# Patient Record
Sex: Female | Born: 1950 | Race: Black or African American | Hispanic: No | Marital: Single | Smoking: Never smoker
Health system: Southern US, Community
[De-identification: ages and names within clinical notes are randomized; demographics above are authoritative.]

## PROBLEM LIST (undated history)

## (undated) HISTORY — PX: BACK SURGERY: SHX140

## (undated) HISTORY — PX: HEMORRHOID SURGERY: SHX153

---

## 1999-05-18 ENCOUNTER — Inpatient Hospital Stay (HOSPITAL_COMMUNITY): Admission: AD | Admit: 1999-05-18 | Discharge: 1999-05-18 | Payer: Self-pay | Admitting: *Deleted

## 2000-02-02 ENCOUNTER — Emergency Department (HOSPITAL_COMMUNITY): Admission: EM | Admit: 2000-02-02 | Discharge: 2000-02-02 | Payer: Self-pay

## 2001-02-24 ENCOUNTER — Emergency Department (HOSPITAL_COMMUNITY): Admission: EM | Admit: 2001-02-24 | Discharge: 2001-02-24 | Payer: Self-pay | Admitting: Emergency Medicine

## 2002-02-25 ENCOUNTER — Emergency Department (HOSPITAL_COMMUNITY): Admission: EM | Admit: 2002-02-25 | Discharge: 2002-02-25 | Payer: Self-pay | Admitting: Emergency Medicine

## 2004-02-27 ENCOUNTER — Emergency Department (HOSPITAL_COMMUNITY): Admission: EM | Admit: 2004-02-27 | Discharge: 2004-02-27 | Payer: Self-pay | Admitting: *Deleted

## 2005-11-07 ENCOUNTER — Emergency Department (HOSPITAL_COMMUNITY): Admission: EM | Admit: 2005-11-07 | Discharge: 2005-11-07 | Payer: Self-pay | Admitting: *Deleted

## 2007-11-25 ENCOUNTER — Emergency Department (HOSPITAL_COMMUNITY): Admission: EM | Admit: 2007-11-25 | Discharge: 2007-11-25 | Payer: Self-pay | Admitting: Emergency Medicine

## 2009-02-08 ENCOUNTER — Emergency Department (HOSPITAL_COMMUNITY): Admission: EM | Admit: 2009-02-08 | Discharge: 2009-02-09 | Payer: Self-pay | Admitting: Emergency Medicine

## 2009-04-06 ENCOUNTER — Ambulatory Visit: Payer: Self-pay | Admitting: Nurse Practitioner

## 2009-04-06 DIAGNOSIS — J45909 Unspecified asthma, uncomplicated: Secondary | ICD-10-CM | POA: Insufficient documentation

## 2009-04-06 DIAGNOSIS — J449 Chronic obstructive pulmonary disease, unspecified: Secondary | ICD-10-CM

## 2009-04-06 DIAGNOSIS — J4489 Other specified chronic obstructive pulmonary disease: Secondary | ICD-10-CM | POA: Insufficient documentation

## 2009-04-06 DIAGNOSIS — K029 Dental caries, unspecified: Secondary | ICD-10-CM | POA: Insufficient documentation

## 2009-04-07 ENCOUNTER — Encounter (INDEPENDENT_AMBULATORY_CARE_PROVIDER_SITE_OTHER): Payer: Self-pay | Admitting: Nurse Practitioner

## 2010-12-26 ENCOUNTER — Encounter: Payer: Self-pay | Admitting: Interventional Cardiology

## 2011-03-17 LAB — PROTIME-INR
INR: 1 (ref 0.00–1.49)
Prothrombin Time: 13.7 seconds (ref 11.6–15.2)

## 2011-03-17 LAB — DIFFERENTIAL
Basophils Absolute: 0 10*3/uL (ref 0.0–0.1)
Basophils Relative: 0 % (ref 0–1)
Neutro Abs: 6.1 10*3/uL (ref 1.7–7.7)
Neutrophils Relative %: 68 % (ref 43–77)

## 2011-03-17 LAB — URINALYSIS, ROUTINE W REFLEX MICROSCOPIC
Glucose, UA: NEGATIVE mg/dL
Leukocytes, UA: NEGATIVE
Protein, ur: NEGATIVE mg/dL
Specific Gravity, Urine: 1.023 (ref 1.005–1.030)
pH: 6.5 (ref 5.0–8.0)

## 2011-03-17 LAB — URINE CULTURE

## 2011-03-17 LAB — COMPREHENSIVE METABOLIC PANEL
Alkaline Phosphatase: 192 U/L — ABNORMAL HIGH (ref 39–117)
BUN: 7 mg/dL (ref 6–23)
Chloride: 105 mEq/L (ref 96–112)
Creatinine, Ser: 0.87 mg/dL (ref 0.4–1.2)
Glucose, Bld: 107 mg/dL — ABNORMAL HIGH (ref 70–99)
Potassium: 3.6 mEq/L (ref 3.5–5.1)
Total Bilirubin: 0.6 mg/dL (ref 0.3–1.2)
Total Protein: 8.7 g/dL — ABNORMAL HIGH (ref 6.0–8.3)

## 2011-03-17 LAB — CBC
HCT: 40.9 % (ref 36.0–46.0)
Hemoglobin: 13.5 g/dL (ref 12.0–15.0)
MCV: 82.7 fL (ref 78.0–100.0)
RDW: 13.6 % (ref 11.5–15.5)

## 2011-03-17 LAB — URINE MICROSCOPIC-ADD ON

## 2011-03-17 LAB — POCT I-STAT, CHEM 8
BUN: 8 mg/dL (ref 6–23)
Calcium, Ion: 1.19 mmol/L (ref 1.12–1.32)
Chloride: 104 mEq/L (ref 96–112)
Creatinine, Ser: 0.9 mg/dL (ref 0.4–1.2)
Glucose, Bld: 102 mg/dL — ABNORMAL HIGH (ref 70–99)

## 2011-03-17 LAB — POCT CARDIAC MARKERS: Troponin i, poc: <0.05 ng/mL (ref 0.00–0.09)

## 2012-09-11 ENCOUNTER — Encounter (HOSPITAL_COMMUNITY): Payer: Self-pay | Admitting: Emergency Medicine

## 2012-09-11 ENCOUNTER — Emergency Department (HOSPITAL_COMMUNITY)
Admission: EM | Admit: 2012-09-11 | Discharge: 2012-09-11 | Disposition: A | Discharge status: Home / Self Care | Payer: Self-pay | Attending: Emergency Medicine | Admitting: Emergency Medicine

## 2012-09-11 DIAGNOSIS — K047 Periapical abscess without sinus: Secondary | ICD-10-CM | POA: Insufficient documentation

## 2012-09-11 DIAGNOSIS — K029 Dental caries, unspecified: Secondary | ICD-10-CM | POA: Insufficient documentation

## 2012-09-11 MED ORDER — PENICILLIN V POTASSIUM 500 MG PO TABS: 500.0000 mg | ORAL_TABLET | Freq: Three times a day (TID) | ORAL | Status: DC

## 2012-09-11 MED ORDER — OXYCODONE-ACETAMINOPHEN 5-325 MG PO TABS
1.0000 | ORAL_TABLET | Freq: Once | ORAL | Status: AC
  Administered 2012-09-11: 1 via ORAL
  Filled 2012-09-11: qty 1

## 2012-09-11 MED ORDER — OXYCODONE-ACETAMINOPHEN 5-325 MG PO TABS: 1.0000 | ORAL_TABLET | Freq: Four times a day (QID) | ORAL | Status: DC | PRN

## 2012-09-11 MED ORDER — PENICILLIN V POTASSIUM 500 MG PO TABS
500.0000 mg | ORAL_TABLET | Freq: Once | ORAL | Status: AC
  Administered 2012-09-11: 500 mg via ORAL
  Filled 2012-09-11: qty 1

## 2012-09-11 NOTE — ED Provider Notes (Signed)
History     CSN: [id number]  Arrival date & time 09/11/12  2209   First MD Initiated Contact with Patient 09/11/12 2318      Chief Complaint  Patient presents with  . Dental Pain    (Consider location/radiation/quality/duration/timing/severity/associated sxs/prior treatment) HPI Comments: Patient presents with 2 days of left mandibular dental pain and swelling. Patient states she's been running low-grade fevers at home. No vomiting. Patient has been taking over-the-counter pain relievers without relief. She's not having any neck pain, neck swelling, difficulty breathing. Onset gradual. Course is constant. Nothing makes symptoms better. Palpation makes symptoms worse.  The history is provided by the patient.    History reviewed. No pertinent past medical history.  No past surgical history on file.  No family history on file.  History  Substance Use Topics  . Smoking status: Never Smoker   . Smokeless tobacco: Not on file  . Alcohol Use: No    OB History    Grav Para Term Preterm Abortions TAB SAB Ect Mult Living                  Review of Systems  Constitutional: Positive for fever.  HENT: Positive for facial swelling and dental problem. Negative for ear pain, sore throat, trouble swallowing and neck pain.   Respiratory: Negative for shortness of breath and stridor.   Skin: Negative for color change.  Neurological: Negative for headaches.    Allergies  Review of patient's allergies indicates no known allergies.  Home Medications   Current Outpatient Rx  Name Route Sig Dispense Refill  . GOODY HEADACHE PO Oral Take 1 packet by mouth every 6 (six) hours as needed. Pain    . OXYCODONE-ACETAMINOPHEN 5-325 MG PO TABS Oral Take 1-2 tablets by mouth every 6 (six) hours as needed for pain. 12 tablet 0  . PENICILLIN V POTASSIUM 500 MG PO TABS Oral Take 1 tablet (500 mg total) by mouth 3 (three) times daily. 21 tablet 0    BP 157/99  Pulse 95  Temp 99.9 F (37.7 C)  (Oral)  Resp 18  SpO2 98%  Physical Exam  Nursing note and vitals reviewed. Constitutional: She appears well-developed and well-nourished.  HENT:  Head: Normocephalic and atraumatic. No trismus in the jaw.  Right Ear: Tympanic membrane, external ear and ear canal normal.  Left Ear: Tympanic membrane, external ear and ear canal normal.  Nose: Nose normal.  Mouth/Throat: Uvula is midline, oropharynx is clear and moist and mucous membranes are normal. Abnormal dentition. Dental caries present. No dental abscesses or uvula swelling. No tonsillar abscesses.       Soft tissue swelling overlying left mandible. Swelling does not extend towards the neck.  Eyes: Conjunctivae normal are normal.  Neck: Normal range of motion. Neck supple.       No neck swelling or Ludwig's angina  Lymphadenopathy:    She has no cervical adenopathy.  Neurological: She is alert.  Skin: Skin is warm and dry.  Psychiatric: She has a normal mood and affect.    ED Course  Procedures (including critical care time)  Labs Reviewed - No data to display No results found.   1. Dental abscess    11:45 PM Patient seen and examined. Medications ordered.   Vital signs reviewed and are as follows: Filed Vitals:   09/11/12 2215  BP: 157/99  Pulse: 95  Temp: 99.9 F (37.7 C)  Resp: 18   Patient counseled to take prescribed medications as directed, return  with worsening facial or neck swelling, and to follow-up with his dentist as soon as possible. Referral given.   Patient counseled on use of narcotic pain medications. Counseled not to combine these medications with others containing tylenol. Urged not to drink alcohol, drive, or perform any other activities that requires focus while taking these medications. The patient verbalizes understanding and agrees with the plan.   MDM  Patient with dental abscess.  Exam unconcerning for Ludwig's angina or other deep tissue infection in neck.  Will treat with penicillin  and pain medicine.  Urged patient to follow-up with dentist.           Renne Crigler, PA 09/12/12 [phone]

## 2012-09-11 NOTE — ED Notes (Signed)
Pt states that she started having pain on her L lower side of her mouth yesterday and now has significant swelling and a low grade fever. States she doesn't go to the dentist and has had problems like this before.

## 2012-09-12 NOTE — ED Provider Notes (Signed)
Medical screening examination/treatment/procedure(s) were performed by non-physician practitioner and as supervising physician I was immediately available for consultation/collaboration.   David H Yao, MD 09/12/12 0624 

## 2016-04-04 ENCOUNTER — Encounter (HOSPITAL_COMMUNITY): Payer: Self-pay

## 2016-04-04 ENCOUNTER — Emergency Department (HOSPITAL_COMMUNITY)
Admission: EM | Admit: 2016-04-04 | Discharge: 2016-04-04 | Disposition: A | Discharge status: Home / Self Care | Payer: No Typology Code available for payment source | Attending: Emergency Medicine | Admitting: Emergency Medicine

## 2016-04-04 ENCOUNTER — Emergency Department (HOSPITAL_COMMUNITY): Payer: No Typology Code available for payment source

## 2016-04-04 DIAGNOSIS — Y999 Unspecified external cause status: Secondary | ICD-10-CM | POA: Diagnosis not present

## 2016-04-04 DIAGNOSIS — Z7982 Long term (current) use of aspirin: Secondary | ICD-10-CM | POA: Insufficient documentation

## 2016-04-04 DIAGNOSIS — Z79899 Other long term (current) drug therapy: Secondary | ICD-10-CM | POA: Diagnosis not present

## 2016-04-04 DIAGNOSIS — Z79891 Long term (current) use of opiate analgesic: Secondary | ICD-10-CM | POA: Diagnosis not present

## 2016-04-04 DIAGNOSIS — S060X1A Concussion with loss of consciousness of 30 minutes or less, initial encounter: Secondary | ICD-10-CM | POA: Insufficient documentation

## 2016-04-04 DIAGNOSIS — R4020 Unspecified coma: Secondary | ICD-10-CM

## 2016-04-04 DIAGNOSIS — R51 Headache: Secondary | ICD-10-CM | POA: Diagnosis present

## 2016-04-04 DIAGNOSIS — Y92410 Unspecified street and highway as the place of occurrence of the external cause: Secondary | ICD-10-CM | POA: Diagnosis not present

## 2016-04-04 DIAGNOSIS — Y939 Activity, unspecified: Secondary | ICD-10-CM | POA: Insufficient documentation

## 2016-04-04 MED ORDER — CYCLOBENZAPRINE HCL 10 MG PO TABS: 10.0000 mg | ORAL_TABLET | Freq: Two times a day (BID) | ORAL | Status: DC | PRN

## 2016-04-04 MED ORDER — NAPROXEN 500 MG PO TABS: 500.0000 mg | ORAL_TABLET | Freq: Two times a day (BID) | ORAL | Status: DC

## 2016-04-04 NOTE — ED Notes (Signed)
Pt walked to restroom and placed in gown for CT.
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
## 2017-09-14 ENCOUNTER — Emergency Department (HOSPITAL_COMMUNITY): Payer: No Typology Code available for payment source

## 2017-09-14 ENCOUNTER — Emergency Department (HOSPITAL_COMMUNITY)
Admission: EM | Admit: 2017-09-14 | Discharge: 2017-09-14 | Disposition: A | Discharge status: Home / Self Care | Payer: No Typology Code available for payment source | Attending: Emergency Medicine | Admitting: Emergency Medicine

## 2017-09-14 ENCOUNTER — Encounter (HOSPITAL_COMMUNITY): Payer: Self-pay

## 2017-09-14 DIAGNOSIS — S5012XA Contusion of left forearm, initial encounter: Secondary | ICD-10-CM | POA: Diagnosis not present

## 2017-09-14 DIAGNOSIS — S60222A Contusion of left hand, initial encounter: Secondary | ICD-10-CM | POA: Insufficient documentation

## 2017-09-14 DIAGNOSIS — Y92410 Unspecified street and highway as the place of occurrence of the external cause: Secondary | ICD-10-CM | POA: Insufficient documentation

## 2017-09-14 DIAGNOSIS — Z79899 Other long term (current) drug therapy: Secondary | ICD-10-CM | POA: Insufficient documentation

## 2017-09-14 DIAGNOSIS — R412 Retrograde amnesia: Secondary | ICD-10-CM | POA: Diagnosis not present

## 2017-09-14 DIAGNOSIS — Y999 Unspecified external cause status: Secondary | ICD-10-CM | POA: Insufficient documentation

## 2017-09-14 DIAGNOSIS — S161XXA Strain of muscle, fascia and tendon at neck level, initial encounter: Secondary | ICD-10-CM | POA: Diagnosis not present

## 2017-09-14 DIAGNOSIS — Y939 Activity, unspecified: Secondary | ICD-10-CM | POA: Diagnosis not present

## 2017-09-14 DIAGNOSIS — S40022A Contusion of left upper arm, initial encounter: Secondary | ICD-10-CM

## 2017-09-14 DIAGNOSIS — T148XXA Other injury of unspecified body region, initial encounter: Secondary | ICD-10-CM

## 2017-09-14 DIAGNOSIS — S0990XA Unspecified injury of head, initial encounter: Secondary | ICD-10-CM | POA: Diagnosis present

## 2017-09-14 MED ORDER — HYDROCODONE-ACETAMINOPHEN 5-325 MG PO TABS: 1.0000 | ORAL_TABLET | ORAL | 0 refills | Status: DC | PRN

## 2017-09-14 MED ORDER — HYDROCODONE-ACETAMINOPHEN 5-325 MG PO TABS
1.0000 | ORAL_TABLET | Freq: Once | ORAL | Status: AC
  Administered 2017-09-14: 1 via ORAL
  Filled 2017-09-14: qty 1

## 2017-09-14 NOTE — Discharge Instructions (Signed)
As we discussed you will be sore for the next few days.   You can take Tylenol or Ibuprofen as directed for pain. You can alternate Tylenol and Ibuprofen every 4 hours. If you take Tylenol at 1pm, then you can take Ibuprofen at 5pm. Then you can take Tylenol again at 9pm.   Take the pain medication for severe or break through pain. Do not take the pain medication at the same time as the tylenol. The pain medication may make you sleepy so do not drive or operate machinery while taking it.   As we discussed, you need to follow up with her primary care doctor or the referred: [place] in the next few days for evaluation of her high blood pressure.  Return to the Emergency Department for any worsening pain, chest pain, difficulty breathing, vomiting, numbness/weakness of your arms or legs, difficulty walking or any other worsening or concerning symptoms.
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
## 2017-09-14 NOTE — ED Notes (Signed)
Pt did not want to sign discharge instructions, denied any questions and reported she understood discharge instructions and prescriptions.
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
## 2017-09-14 NOTE — ED Notes (Signed)
Patient transported to X-ray 
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
## 2020-04-08 ENCOUNTER — Other Ambulatory Visit: Payer: Self-pay | Admitting: Family Medicine

## 2020-04-08 DIAGNOSIS — R42 Dizziness and giddiness: Secondary | ICD-10-CM

## 2020-04-08 DIAGNOSIS — R531 Weakness: Secondary | ICD-10-CM

## 2020-04-08 DIAGNOSIS — Z8659 Personal history of other mental and behavioral disorders: Secondary | ICD-10-CM

## 2020-04-23 ENCOUNTER — Ambulatory Visit
Admission: RE | Admit: 2020-04-23 | Discharge: 2020-04-23 | Disposition: A | Discharge status: Home / Self Care | Payer: Medicare Other | Source: Ambulatory Visit | Attending: Family Medicine | Admitting: Family Medicine

## 2020-04-23 DIAGNOSIS — Z8659 Personal history of other mental and behavioral disorders: Secondary | ICD-10-CM

## 2020-04-23 DIAGNOSIS — R42 Dizziness and giddiness: Secondary | ICD-10-CM

## 2020-04-23 DIAGNOSIS — R531 Weakness: Secondary | ICD-10-CM

## 2020-11-21 ENCOUNTER — Other Ambulatory Visit: Payer: Medicare Other

## 2020-11-21 DIAGNOSIS — Z20822 Contact with and (suspected) exposure to covid-19: Secondary | ICD-10-CM

## 2020-11-24 LAB — NOVEL CORONAVIRUS, NAA: SARS-CoV-2, NAA: NOT DETECTED

## 2021-02-18 IMAGING — CT CT HEAD W/O CM
3 of 4 series · 15 of 47 positions shown, 18 images · non-contrast
Comparison: Head CT 09/14/2017

CLINICAL DATA: Mental status change resolved. Dizziness. Weakness.
Additional history provided: Patient reports dizziness for "a couple
of months."

EXAM:
CT HEAD WITHOUT CONTRAST
TECHNIQUE: Contiguous axial images were obtained from the base of the skull
through the vertex without intravenous contrast.

[Series 2: head 5.00 hr40 s3 axial ibhc · axial · 0.45mm/px · z∈[-570,-430]mm · 9 of 34 slices shown, 12 images]
[im 3/34  brain]
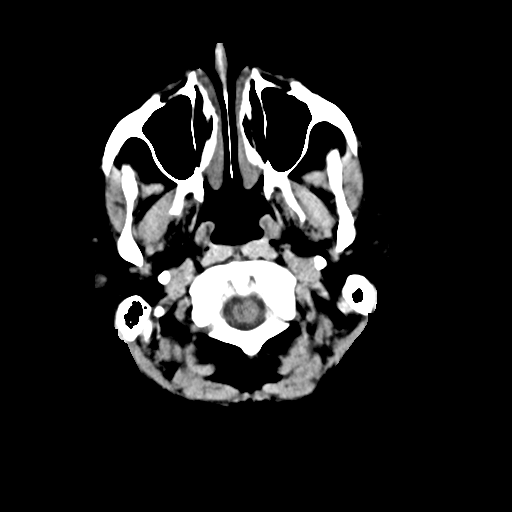
[im 3/34  bone]
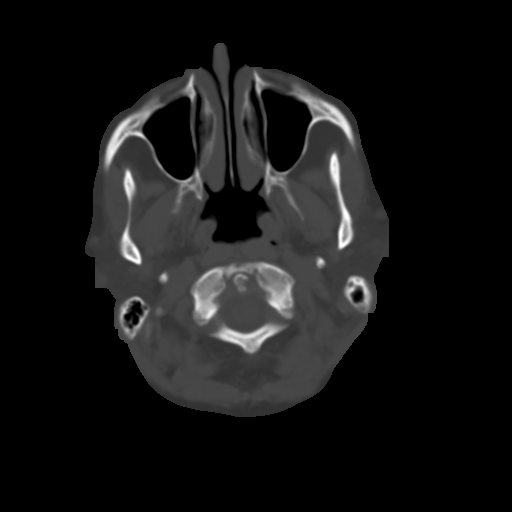
[im 8/34  brain]
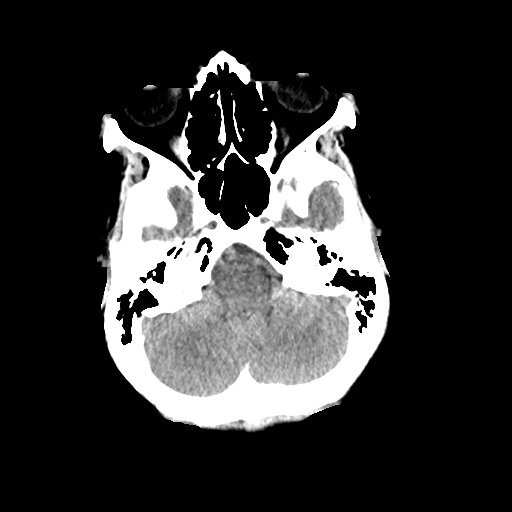
[im 10/34  brain]
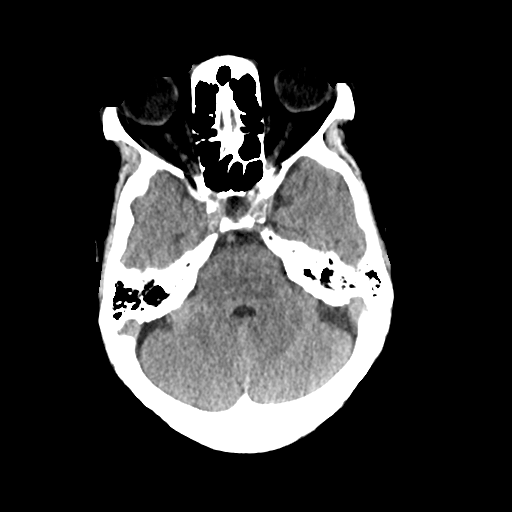
[im 15/34  brain]
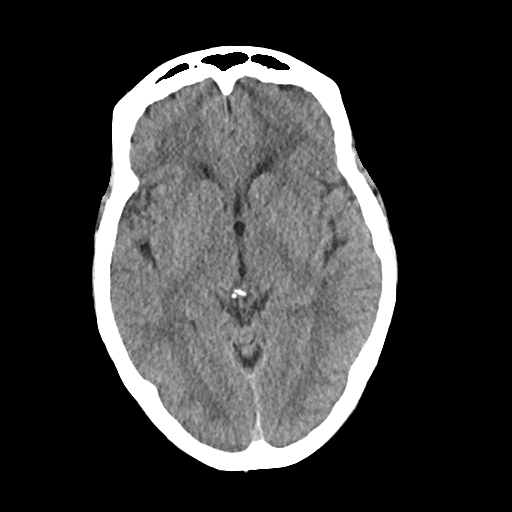
[im 17/34  brain]
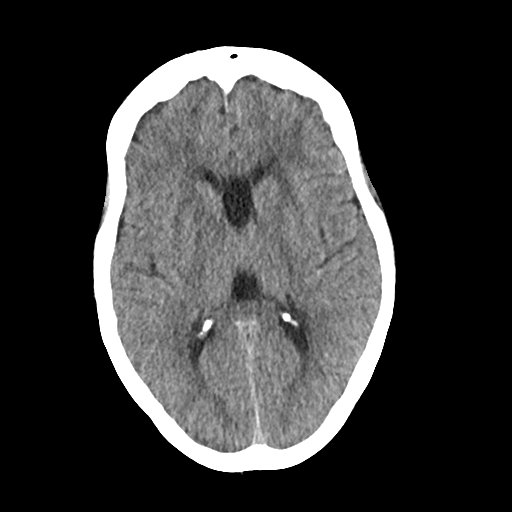
[im 17/34  bone]
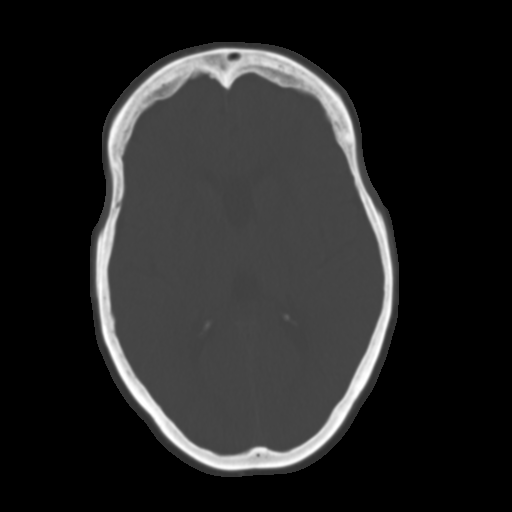
[im 19/34  brain]
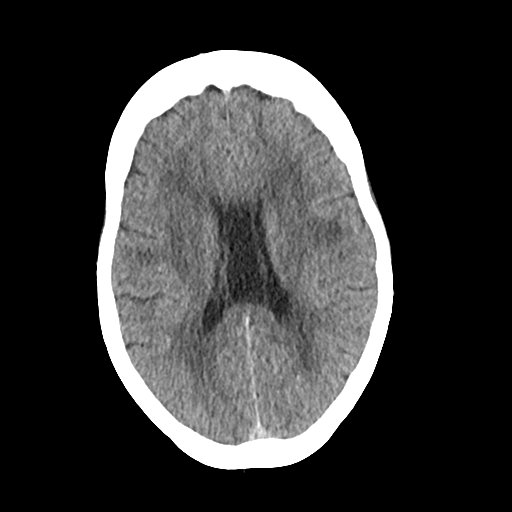
[im 24/34  brain]
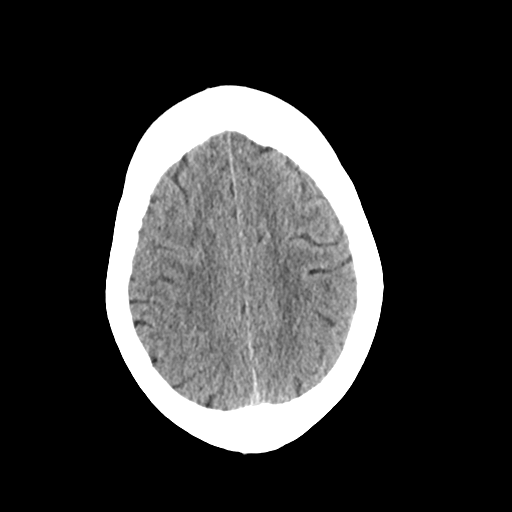
[im 26/34  brain]
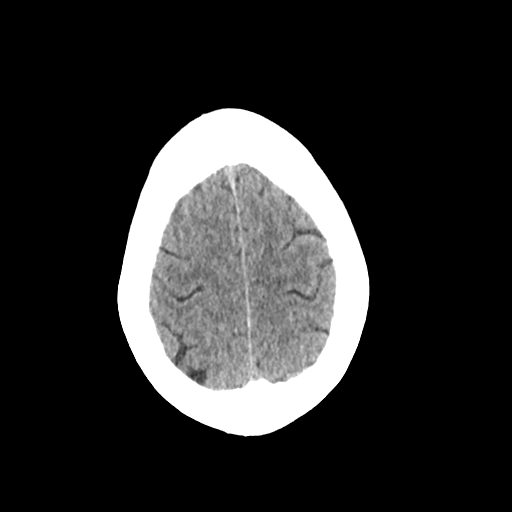
[im 31/34  brain]
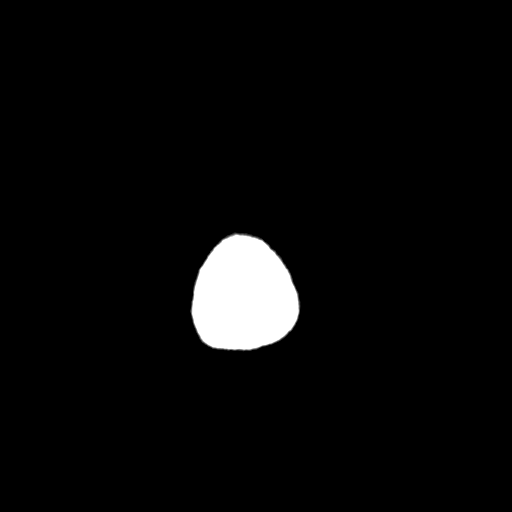
[im 31/34  bone]
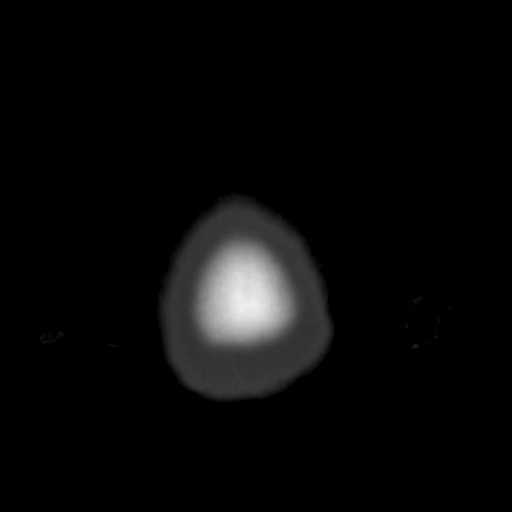

[Series 4: head 3.00 hr40 s3 sag · sagittal · 0.34mm/px · 3 of 59 slices shown]
[im 20/59  brain]
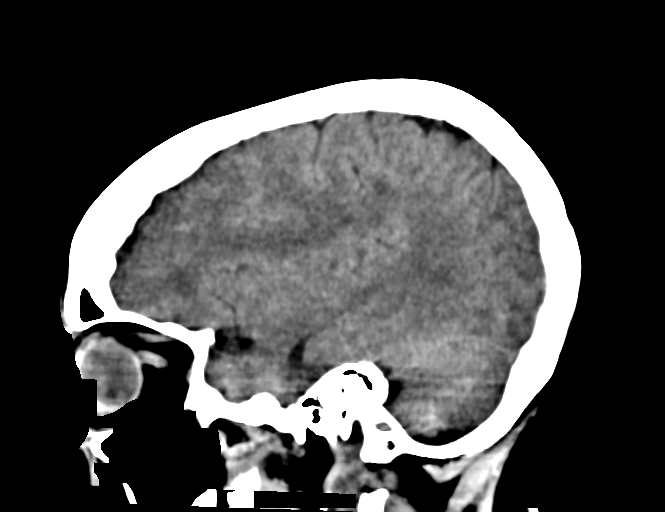
[im 30/59  brain]
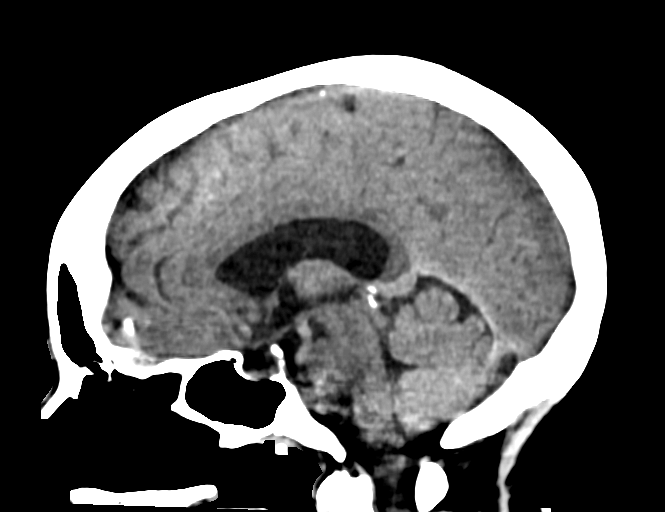
[im 39/59  brain]
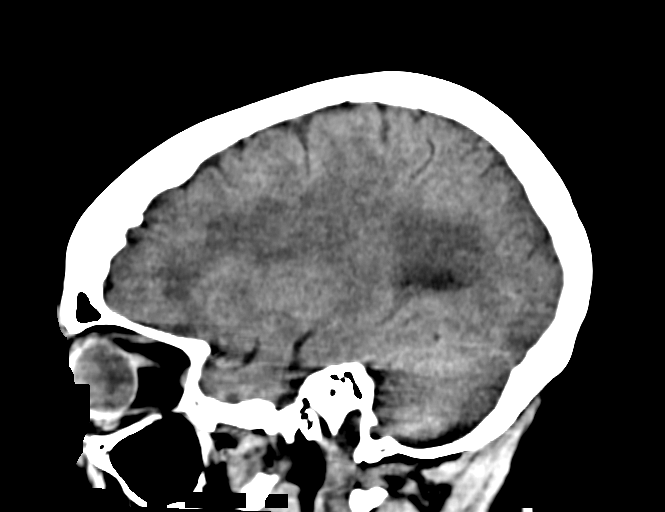

[Series 6: head 3.00 hr40 s3 cor · coronal · 0.34mm/px · 3 of 72 slices shown]
[im 24/72  brain]
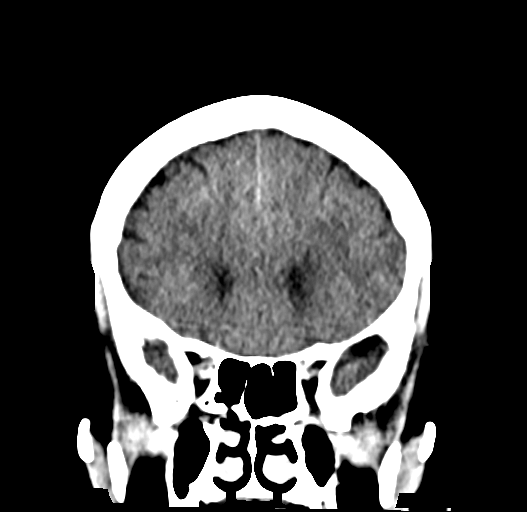
[im 32/72  brain]
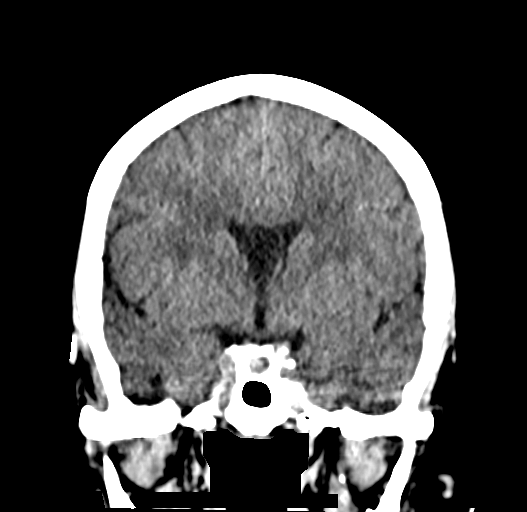
[im 40/72  brain]
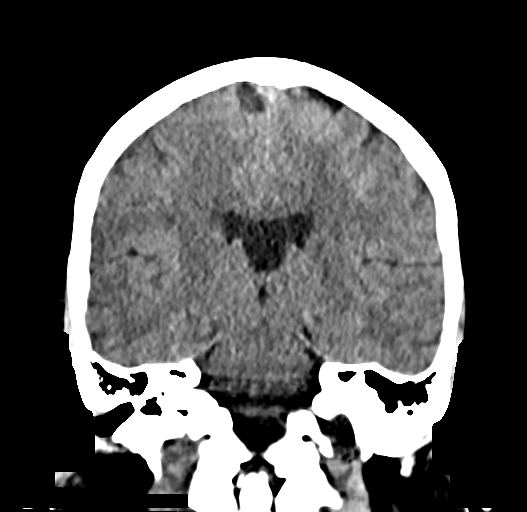

[15 of 47 positions shown; findings below may reference images not displayed]

FINDINGS: Brain:

Cerebral volume is normal for age.

Stable moderate ill-defined hypoattenuation within the cerebral
white matter which is nonspecific, but most commonly seen on the
basis of chronic small vessel ischemia.

There is no acute intracranial hemorrhage.

No demarcated cortical infarct.

No extra-axial fluid collection.

No evidence of intracranial mass.

No midline shift.

Cavum septum pellucidum and cavum vergae.

Vascular: No hyperdense vessel.

Skull: Normal. Negative for fracture or focal lesion.

Sinuses/Orbits: Visualized orbits show no acute finding. No
significant paranasal sinus disease or mastoid effusion.

Other: 9 mm pedunculated soft tissue lesion versus hyperdense cyst
overlying the right aspect of the nose.
IMPRESSION: No evidence of acute intracranial abnormality.

Stable moderate ill-defined hypoattenuation within the cerebral
white matter which is nonspecific, but most commonly seen on the
basis of chronic small vessel ischemia.

9 mm pedunculated soft tissue lesion versus hyperdense cyst
overlying the right aspect of the nose. Correlate with findings on
direct visualization.

## 2021-04-26 ENCOUNTER — Ambulatory Visit: Payer: Medicare Other | Admitting: Podiatry

## 2021-09-09 ENCOUNTER — Encounter: Payer: Self-pay | Admitting: Nurse Practitioner

## 2021-09-09 ENCOUNTER — Ambulatory Visit (INDEPENDENT_AMBULATORY_CARE_PROVIDER_SITE_OTHER): Payer: Medicare Other | Admitting: Nurse Practitioner

## 2021-09-09 ENCOUNTER — Other Ambulatory Visit: Payer: Self-pay

## 2021-09-09 VITALS — BP 124/80 | HR 94 | Temp 98.1°F | Ht 65.0 in | Wt 138.0 lb

## 2021-09-09 DIAGNOSIS — R079 Chest pain, unspecified: Secondary | ICD-10-CM

## 2021-09-09 DIAGNOSIS — R531 Weakness: Secondary | ICD-10-CM | POA: Diagnosis not present

## 2021-09-09 DIAGNOSIS — R202 Paresthesia of skin: Secondary | ICD-10-CM

## 2021-09-09 DIAGNOSIS — R200 Anesthesia of skin: Secondary | ICD-10-CM

## 2021-09-09 DIAGNOSIS — E559 Vitamin D deficiency, unspecified: Secondary | ICD-10-CM | POA: Diagnosis not present

## 2021-09-09 DIAGNOSIS — R82998 Other abnormal findings in urine: Secondary | ICD-10-CM

## 2021-09-09 DIAGNOSIS — K5909 Other constipation: Secondary | ICD-10-CM

## 2021-09-09 DIAGNOSIS — Z79899 Other long term (current) drug therapy: Secondary | ICD-10-CM

## 2021-09-09 DIAGNOSIS — Z7689 Persons encountering health services in other specified circumstances: Secondary | ICD-10-CM

## 2021-09-09 DIAGNOSIS — K219 Gastro-esophageal reflux disease without esophagitis: Secondary | ICD-10-CM

## 2021-09-09 DIAGNOSIS — Z0000 Encounter for general adult medical examination without abnormal findings: Secondary | ICD-10-CM | POA: Diagnosis not present

## 2021-09-09 DIAGNOSIS — M62838 Other muscle spasm: Secondary | ICD-10-CM

## 2021-09-09 DIAGNOSIS — R7303 Prediabetes: Secondary | ICD-10-CM

## 2021-09-09 DIAGNOSIS — R051 Acute cough: Secondary | ICD-10-CM

## 2021-09-09 DIAGNOSIS — D229 Melanocytic nevi, unspecified: Secondary | ICD-10-CM

## 2021-09-09 LAB — POCT URINALYSIS DIPSTICK
Bilirubin, UA: NEGATIVE
Glucose, UA: NEGATIVE
Ketones, UA: NEGATIVE
Nitrite, UA: NEGATIVE
Protein, UA: NEGATIVE
Spec Grav, UA: >=1.03 — AB (ref 1.010–1.025)
Urobilinogen, UA: 0.2 E.U./dL
pH, UA: 6.5 (ref 5.0–8.0)

## 2021-09-09 LAB — POCT UA - MICROALBUMIN
Creatinine, POC: 300 mg/dL
Microalbumin Ur, POC: 80 mg/L

## 2021-09-09 MED ORDER — PANTOPRAZOLE SODIUM 20 MG PO TBEC: 20.0000 mg | DELAYED_RELEASE_TABLET | Freq: Every day | ORAL | 1 refills | Status: DC

## 2021-09-09 MED ORDER — NITROFURANTOIN MONOHYD MACRO 100 MG PO CAPS: 100.0000 mg | ORAL_CAPSULE | Freq: Two times a day (BID) | ORAL | 0 refills | Status: AC

## 2021-09-09 MED ORDER — BENZONATATE 100 MG PO CAPS: 100.0000 mg | ORAL_CAPSULE | Freq: Three times a day (TID) | ORAL | 1 refills | Status: DC | PRN

## 2021-09-09 MED ORDER — GUAIFENESIN-DM 100-10 MG/5ML PO SYRP: 5.0000 mL | ORAL_SOLUTION | ORAL | 0 refills | Status: DC | PRN

## 2021-09-09 NOTE — Progress Notes (Addendum)
This visit occurred during the SARS-CoV-2 public health emergency.  Safety protocols were in place, including screening questions prior to the visit, additional usage of staff PPE, and extensive cleaning of exam room while observing appropriate contact time as indicated for disinfecting solutions.  Subjective:     Patient ID: Casey Zhang , female    DOB: [birth year] , 70 y.o.   MRN: [id number]   Chief Complaint  Patient presents with   Establish Care    HPI  Patient is here to establish care and for a complete physical exam. She has not seen a provider in a very long time. She feels weak all the time. She also takes vinegar and water. She takes goody's all the time.    History reviewed. No pertinent past medical history.   Family History  Problem Relation Age of Onset   Diabetes Maternal Grandmother      Current Outpatient Medications:    benzonatate (TESSALON PERLES) 100 MG capsule, Take 1 capsule (100 mg total) by mouth 3 (three) times daily as needed for cough., Disp: 30 capsule, Rfl: 1   guaiFENesin-dextromethorphan (ROBITUSSIN DM) 100-10 MG/5ML syrup, Take 5 mLs by mouth every 4 (four) hours as needed for cough., Disp: 118 mL, Rfl: 0   nitrofurantoin, macrocrystal-monohydrate, (MACROBID) 100 MG capsule, Take 1 capsule (100 mg total) by mouth 2 (two) times daily for 7 days., Disp: 14 capsule, Rfl: 0   pantoprazole (PROTONIX) 20 MG tablet, Take 1 tablet (20 mg total) by mouth daily., Disp: 30 tablet, Rfl: 1   Aspirin-Acetaminophen-Caffeine (GOODY HEADACHE PO), Take 1 packet by mouth every 6 (six) hours as needed. Pain (Patient not taking: Reported on 09/09/2021), Disp: , Rfl:    cyclobenzaprine (FLEXERIL) 10 MG tablet, Take 1 tablet (10 mg total) by mouth 2 (two) times daily as needed for muscle spasms. (Patient not taking: Reported on 09/09/2021), Disp: 20 tablet, Rfl: 0   HYDROcodone-acetaminophen (NORCO/VICODIN) 5-325 MG tablet, Take 1-2 tablets by mouth every 4 (four) hours as  needed. (Patient not taking: Reported on 09/09/2021), Disp: 8 tablet, Rfl: 0   naproxen (NAPROSYN) 500 MG tablet, Take 1 tablet (500 mg total) by mouth 2 (two) times daily. (Patient not taking: Reported on 09/09/2021), Disp: 30 tablet, Rfl: 0   oxyCODONE-acetaminophen (PERCOCET/ROXICET) 5-325 MG per tablet, Take 1-2 tablets by mouth every 6 (six) hours as needed for pain. (Patient not taking: Reported on 04/04/2016), Disp: 12 tablet, Rfl: 0   penicillin v potassium (VEETID) 500 MG tablet, Take 1 tablet (500 mg total) by mouth 3 (three) times daily. (Patient not taking: Reported on 04/04/2016), Disp: 21 tablet, Rfl: 0   No Known Allergies    The patient states she uses none for birth control. Last LMP was No LMP recorded. Patient is postmenopausal.. Negative for Dysmenorrhea. Negative for: breast discharge, breast lump(s), breast pain and breast self exam. Associated symptoms include abnormal vaginal bleeding. Pertinent negatives include abnormal bleeding (hematology), anxiety, decreased libido, depression, difficulty falling sleep, dyspareunia, history of infertility, nocturia, sexual dysfunction, sleep disturbances, urinary incontinence, urinary urgency, vaginal discharge and vaginal itching. Diet regular.The patient states her exercise level is    . The patient's tobacco use is:  Social History   Tobacco Use  Smoking Status Never  Smokeless Tobacco Never  . She has been exposed to passive smoke. The patient's alcohol use is:  Social History   Substance and Sexual Activity  Alcohol Use No   Review of Systems  Constitutional: Negative.  Negative for chills  and fever.  HENT:  Negative for congestion, ear pain, rhinorrhea and tinnitus.   Respiratory:  Positive for cough. Negative for shortness of breath and wheezing.   Cardiovascular: Negative.  Negative for chest pain and palpitations.  Gastrointestinal: Negative.   Endocrine: Negative for polydipsia, polyphagia and polyuria.  Genitourinary:   Positive for frequency.  Musculoskeletal:  Positive for myalgias.       Muscle pain to the legs   Neurological:  Positive for weakness and numbness. Negative for dizziness, seizures and headaches.  Psychiatric/Behavioral: Negative.      Today's Vitals   09/09/21 1555 09/09/21 1619 09/09/21 1620  BP: 122/80 122/80 124/80  Pulse: 71 83 94  Temp: 98.1 F (36.7 C)    Weight: 138 lb (62.6 kg)    Height: $Remove'5\' 5"'IouXehy$  (1.651 m)     Body mass index is 22.96 kg/m.   Objective:  Physical Exam Vitals and nursing note reviewed.  Constitutional:      Appearance: Normal appearance.  HENT:     Head: Normocephalic and atraumatic.     Right Ear: Tympanic membrane, ear canal and external ear normal. There is no impacted cerumen.     Left Ear: Tympanic membrane, ear canal and external ear normal. There is no impacted cerumen.     Nose: Nose normal. No congestion or rhinorrhea.     Mouth/Throat:     Mouth: Mucous membranes are moist.     Pharynx: Oropharynx is clear.  Eyes:     Extraocular Movements: Extraocular movements intact.     Conjunctiva/sclera: Conjunctivae normal.     Pupils: Pupils are equal, round, and reactive to light.  Cardiovascular:     Rate and Rhythm: Normal rate and regular rhythm.     Pulses: Normal pulses.     Heart sounds: Normal heart sounds. No murmur heard. Pulmonary:     Effort: Pulmonary effort is normal. No respiratory distress.     Breath sounds: Normal breath sounds. No wheezing.  Abdominal:     General: Abdomen is flat. Bowel sounds are normal.     Palpations: Abdomen is soft.     Tenderness: There is no abdominal tenderness.  Genitourinary:    Comments: deferred Musculoskeletal:        General: No swelling or tenderness. Normal range of motion.     Cervical back: Normal range of motion and neck supple.  Skin:    General: Skin is warm and dry.     Capillary Refill: Capillary refill takes less than 2 seconds.     Comments: Numorous skin tags and moles    Neurological:     General: No focal deficit present.     Mental Status: She is alert and oriented to person, place, and time.  Psychiatric:        Mood and Affect: Mood normal.        Behavior: Behavior normal.        Assessment And Plan:     1. Encounter to establish care  2. Encounter for physical examination - POCT Urinalysis Dipstick (81002) - POCT UA - Microalbumin  3. Chest pain, unspecified type - EKG 12-Lead  4. Weakness - TSH + free T4 - CBC no Diff - CMP14+EGFR  5. Vitamin D deficiency - Vitamin D (25 hydroxy)  6. Prediabetes - Hemoglobin A1c  7. Gastroesophageal reflux disease without esophagitis - pantoprazole (PROTONIX) 20 MG tablet; Take 1 tablet (20 mg total) by mouth daily.  Dispense: 30 tablet; Refill: 1  8. Numbness  and tingling - Vitamin B12  9. Drug therapy - Lipid panel  10. Leukocytes in urine - Culture, Urine - nitrofurantoin, macrocrystal-monohydrate, (MACROBID) 100 MG capsule; Take 1 capsule (100 mg total) by mouth 2 (two) times daily for 7 days.  Dispense: 14 capsule; Refill: 0  11. Acute cough - guaiFENesin-dextromethorphan (ROBITUSSIN DM) 100-10 MG/5ML syrup; Take 5 mLs by mouth every 4 (four) hours as needed for cough.  Dispense: 118 mL; Refill: 0 - benzonatate (TESSALON PERLES) 100 MG capsule; Take 1 capsule (100 mg total) by mouth 3 (three) times daily as needed for cough.  Dispense: 30 capsule; Refill: 1  12. Numerous skin moles - Ambulatory referral to Dermatology  13. Other constipation -given samples of linzess and encouraged a healthy diet. Increase water intake and fiber.   14. Muscle spasms of both lower extremities  -Encouraged patient to do stretching and at home exercises.  -She can take OTC magnesium and potassium.   The patient was encouraged to call or send a message through [facility] for any questions or concerns.   Follow up: if symptoms persist or do not get better.   Side effects and appropriate use of all  the medication(s) were discussed with the patient today. Patient advised to use the medication(s) as directed by their healthcare provider. The patient was encouraged to read, review, and understand all associated package inserts and contact our office with any questions or concerns. The patient accepts the risks of the treatment plan and had an opportunity to ask questions.   Staying healthy and adopting a healthy lifestyle for your overall health is important. You should eat 7 or more servings of fruits and vegetables per day. You should drink plenty of water to keep yourself hydrated and your kidneys healthy. This includes about 65-80+ fluid ounces of water. Limit your intake of animal fats especially for elevated cholesterol. Avoid highly processed food and limit your salt intake if you have hypertension. Avoid foods high in saturated/Trans fats. Along with a healthy diet it is also very important to maintain time for yourself to maintain a healthy mental health with low stress levels. You should get atleast 150 min of moderate intensity exercise weekly for a healthy heart. Along with eating right and exercising, aim for at least 7-9 hours of sleep daily.  Eat more whole grains which includes barley, wheat berries, oats, brown rice and whole wheat pasta. Use healthy plant oils which include olive, soy, corn, sunflower and peanut. Limit your caffeine and sugary drinks. Limit your intake of fast foods. Limit milk and dairy products to one or two daily servings.   Patient was given opportunity to ask questions. Patient verbalized understanding of the plan and was able to repeat key elements of the plan. All questions were answered to their satisfaction.  Raman Malakye Nolden, DNP   I, Raman Keir Viernes have reviewed all documentation for this visit. The documentation on 09/09/21 for the exam, diagnosis, procedures, and orders are all accurate and complete.    THE PATIENT IS ENCOURAGED TO PRACTICE SOCIAL DISTANCING  DUE TO THE COVID-19 PANDEMIC.

## 2021-09-09 NOTE — Progress Notes (Signed)
I,Alda Gaultney T Zykerria Tanton,acting as a Education administrator for [employer], NP.,have documented all relevant documentation on the behalf of [employer], NP,as directed by  Bary Castilla, NP while in the presence of Bary Castilla, NP.  This visit occurred during the SARS-CoV-2 public health emergency.  Safety protocols were in place, including screening questions prior to the visit, additional usage of staff PPE, and extensive cleaning of exam room while observing appropriate contact time as indicated for disinfecting solutions.  Subjective:     Patient ID: Casey Zhang , female    DOB: [birth year] , 70 y.o.   MRN: [id number]   Chief Complaint  Patient presents with   Establish Care     HPI  Pt is here today to establish care. She has a big concern about pain she feels all over her body, she takes over the counter pain medication currently that does not work for her. She also complains of constant dizziness, everyday. She takes a mixture of water & vinegar  to work , she drinks at most 3 times daily which does help with her dizziness.     History reviewed. No pertinent past medical history.   Family History  Problem Relation Age of Onset   Diabetes Maternal Grandmother      Current Outpatient Medications:    Aspirin-Acetaminophen-Caffeine (GOODY HEADACHE PO), Take 1 packet by mouth every 6 (six) hours as needed. Pain (Patient not taking: Reported on 09/09/2021), Disp: , Rfl:    cyclobenzaprine (FLEXERIL) 10 MG tablet, Take 1 tablet (10 mg total) by mouth 2 (two) times daily as needed for muscle spasms. (Patient not taking: Reported on 09/09/2021), Disp: 20 tablet, Rfl: 0   HYDROcodone-acetaminophen (NORCO/VICODIN) 5-325 MG tablet, Take 1-2 tablets by mouth every 4 (four) hours as needed. (Patient not taking: Reported on 09/09/2021), Disp: 8 tablet, Rfl: 0   naproxen (NAPROSYN) 500 MG tablet, Take 1 tablet (500 mg total) by mouth 2 (two) times daily. (Patient not taking: Reported on  09/09/2021), Disp: 30 tablet, Rfl: 0   oxyCODONE-acetaminophen (PERCOCET/ROXICET) 5-325 MG per tablet, Take 1-2 tablets by mouth every 6 (six) hours as needed for pain. (Patient not taking: Reported on 04/04/2016), Disp: 12 tablet, Rfl: 0   penicillin v potassium (VEETID) 500 MG tablet, Take 1 tablet (500 mg total) by mouth 3 (three) times daily. (Patient not taking: Reported on 04/04/2016), Disp: 21 tablet, Rfl: 0   No Known Allergies   Review of Systems  Constitutional: Negative.   Respiratory: Negative.    Cardiovascular: Negative.   Neurological: Negative.   Psychiatric/Behavioral: Negative.      Today's Vitals   09/09/21 1555 09/09/21 1619 09/09/21 1620  BP: 122/80 122/80 124/80  Pulse: 71 83 94  Temp: 98.1 F (36.7 C)    Weight: 138 lb (62.6 kg)    Height: 5\' 5"  (1.651 m)     Body mass index is 22.96 kg/m.  Wt Readings from Last 3 Encounters:  09/09/21 138 lb (62.6 kg)    Objective:  Physical Exam      Assessment And Plan:     1. Encounter to establish care    Patient was given opportunity to ask questions. Patient verbalized understanding of the plan and was able to repeat key elements of the plan. All questions were answered to their satisfaction.  Debbora Dus, CMA   I, Debbora Dus, CMA, have reviewed all documentation for this visit. The documentation on 09/09/21 for the exam, diagnosis, procedures, and orders are all accurate  and complete.   IF YOU HAVE BEEN REFERRED TO A SPECIALIST, IT MAY TAKE 1-2 WEEKS TO SCHEDULE/PROCESS THE REFERRAL. IF YOU HAVE NOT HEARD FROM US/SPECIALIST IN TWO WEEKS, PLEASE GIVE US A CALL AT [phone] X [phone].   THE PATIENT IS ENCOURAGED TO PRACTICE SOCIAL DISTANCING DUE TO THE COVID-19 PANDEMIC.    

## 2021-09-10 ENCOUNTER — Other Ambulatory Visit: Payer: Self-pay

## 2021-09-10 DIAGNOSIS — K219 Gastro-esophageal reflux disease without esophagitis: Secondary | ICD-10-CM

## 2021-09-10 LAB — CMP14+EGFR
ALT: 16 IU/L (ref 0–32)
AST: 21 IU/L (ref 0–40)
Albumin/Globulin Ratio: 1.4 (ref 1.2–2.2)
Albumin: 4.6 g/dL (ref 3.8–4.8)
Alkaline Phosphatase: 96 IU/L (ref 44–121)
BUN/Creatinine Ratio: 18 (ref 12–28)
BUN: 16 mg/dL (ref 8–27)
Bilirubin Total: <0.2 mg/dL (ref 0.0–1.2)
CO2: 26 mmol/L (ref 20–29)
Calcium: 10.6 mg/dL — ABNORMAL HIGH (ref 8.7–10.3)
Chloride: 101 mmol/L (ref 96–106)
Creatinine, Ser: 0.91 mg/dL (ref 0.57–1.00)
Globulin, Total: 3.4 g/dL (ref 1.5–4.5)
Glucose: 92 mg/dL (ref 70–99)
Potassium: 4.5 mmol/L (ref 3.5–5.2)
Sodium: 143 mmol/L (ref 134–144)
Total Protein: 8 g/dL (ref 6.0–8.5)
eGFR: 68 mL/min/{1.73_m2} (ref >59)

## 2021-09-10 LAB — LIPID PANEL
Chol/HDL Ratio: 3 ratio (ref 0.0–4.4)
Cholesterol, Total: 129 mg/dL (ref 100–199)
HDL: 43 mg/dL (ref >39)
LDL Chol Calc (NIH): 64 mg/dL (ref 0–99)
Triglycerides: 125 mg/dL (ref 0–149)
VLDL Cholesterol Cal: 22 mg/dL (ref 5–40)

## 2021-09-10 LAB — CBC
Hematocrit: 40.5 % (ref 34.0–46.6)
Hemoglobin: 13.4 g/dL (ref 11.1–15.9)
MCH: 26.2 pg — ABNORMAL LOW (ref 26.6–33.0)
MCHC: 33.1 g/dL (ref 31.5–35.7)
MCV: 79 fL (ref 79–97)
Platelets: 400 10*3/uL (ref 150–450)
RBC: 5.11 x10E6/uL (ref 3.77–5.28)
RDW: 12.8 % (ref 11.7–15.4)
WBC: 6.5 10*3/uL (ref 3.4–10.8)

## 2021-09-10 LAB — VITAMIN D 25 HYDROXY (VIT D DEFICIENCY, FRACTURES): Vit D, 25-Hydroxy: 40.6 ng/mL (ref 30.0–100.0)

## 2021-09-10 LAB — TSH+FREE T4
Free T4: 1.2 ng/dL (ref 0.82–1.77)
TSH: 0.321 u[IU]/mL — ABNORMAL LOW (ref 0.450–4.500)

## 2021-09-10 LAB — VITAMIN B12: Vitamin B-12: 842 pg/mL (ref 232–1245)

## 2021-09-10 LAB — HEMOGLOBIN A1C
Est. average glucose Bld gHb Est-mCnc: 134 mg/dL
Hgb A1c MFr Bld: 6.3 % — ABNORMAL HIGH (ref 4.8–5.6)

## 2021-09-10 MED ORDER — PANTOPRAZOLE SODIUM 20 MG PO TBEC: 20.0000 mg | DELAYED_RELEASE_TABLET | Freq: Every day | ORAL | 1 refills | Status: DC

## 2021-09-12 LAB — URINE CULTURE

## 2021-09-14 ENCOUNTER — Other Ambulatory Visit: Payer: Self-pay | Admitting: Nurse Practitioner

## 2021-09-14 DIAGNOSIS — E0590 Thyrotoxicosis, unspecified without thyrotoxic crisis or storm: Secondary | ICD-10-CM

## 2021-09-15 ENCOUNTER — Ambulatory Visit: Payer: Medicare Other | Admitting: Nurse Practitioner

## 2021-09-21 ENCOUNTER — Other Ambulatory Visit: Payer: Self-pay | Admitting: Nurse Practitioner

## 2021-09-21 ENCOUNTER — Other Ambulatory Visit: Payer: Medicare Other

## 2021-09-21 ENCOUNTER — Other Ambulatory Visit: Payer: Self-pay

## 2021-09-21 DIAGNOSIS — M62838 Other muscle spasm: Secondary | ICD-10-CM

## 2021-09-21 MED ORDER — CYCLOBENZAPRINE HCL 10 MG PO TABS: 10.0000 mg | ORAL_TABLET | Freq: Three times a day (TID) | ORAL | 0 refills | Status: DC | PRN

## 2021-09-22 LAB — PTH, INTACT AND CALCIUM
Calcium: 9.8 mg/dL (ref 8.7–10.3)
PTH: 37 pg/mL (ref 15–65)

## 2021-09-23 ENCOUNTER — Other Ambulatory Visit: Payer: Self-pay | Admitting: Nurse Practitioner

## 2021-10-07 ENCOUNTER — Ambulatory Visit: Payer: Medicare Other | Admitting: Nurse Practitioner

## 2021-11-15 ENCOUNTER — Ambulatory Visit: Payer: Medicare Other | Admitting: Nurse Practitioner

## 2021-11-16 ENCOUNTER — Ambulatory Visit (INDEPENDENT_AMBULATORY_CARE_PROVIDER_SITE_OTHER): Payer: Medicare Other | Admitting: Nurse Practitioner

## 2021-11-16 ENCOUNTER — Ambulatory Visit: Payer: Medicare Other | Admitting: Nurse Practitioner

## 2021-11-16 ENCOUNTER — Encounter: Payer: Self-pay | Admitting: Nurse Practitioner

## 2021-11-16 ENCOUNTER — Other Ambulatory Visit: Payer: Self-pay

## 2021-11-16 VITALS — Temp 98.8°F | Ht 64.0 in | Wt 161.2 lb

## 2021-11-16 DIAGNOSIS — R079 Chest pain, unspecified: Secondary | ICD-10-CM

## 2021-11-16 DIAGNOSIS — K30 Functional dyspepsia: Secondary | ICD-10-CM | POA: Diagnosis not present

## 2021-11-16 DIAGNOSIS — R42 Dizziness and giddiness: Secondary | ICD-10-CM | POA: Diagnosis not present

## 2021-11-16 DIAGNOSIS — R319 Hematuria, unspecified: Secondary | ICD-10-CM

## 2021-11-16 DIAGNOSIS — E0590 Thyrotoxicosis, unspecified without thyrotoxic crisis or storm: Secondary | ICD-10-CM

## 2021-11-16 DIAGNOSIS — R82998 Other abnormal findings in urine: Secondary | ICD-10-CM

## 2021-11-16 LAB — POCT URINALYSIS DIPSTICK
Glucose, UA: NEGATIVE
Ketones, UA: NEGATIVE
Nitrite, UA: NEGATIVE
Protein, UA: NEGATIVE
Spec Grav, UA: >=1.03 — AB (ref 1.010–1.025)
Urobilinogen, UA: 0.2 E.U./dL
pH, UA: 5 (ref 5.0–8.0)

## 2021-11-16 NOTE — Patient Instructions (Signed)

## 2021-11-16 NOTE — Progress Notes (Signed)
This visit occurred during the SARS-CoV-2 public health emergency.  Safety protocols were in place, including screening questions prior to the visit, additional usage of staff PPE, and extensive cleaning of exam room while observing appropriate contact time as indicated for disinfecting solutions.  Subjective:     Patient ID: Casey Zhang , female    DOB: 27-Nov-1951 , 69 y.o.   MRN: [id number]   Chief Complaint  Patient presents with   Dizziness   HPI  Patient is here for dizziness. She also has complains of chest pain and body aches.  She still is taking BC or goody's powder everyday atleast 2-3 times. She still haven't seen a endocrinologist. She is having a hard time at work due to her indigestion and lack of energy. Reviewed labs with the patient from previous visit. Patient has a FMLA form that she brought with her.   Dizziness Associated symptoms include chest pain and fatigue. Pertinent negatives include no arthralgias, congestion, coughing, myalgias, vomiting or weakness.    No past medical history on file.   Family History  Problem Relation Age of Onset   Diabetes Maternal Grandmother      Current Outpatient Medications:    Aspirin-Acetaminophen-Caffeine (GOODY HEADACHE PO), Take 1 packet by mouth every 6 (six) hours as needed. Pain (Patient not taking: Reported on 09/09/2021), Disp: , Rfl:    benzonatate (TESSALON PERLES) 100 MG capsule, Take 1 capsule (100 mg total) by mouth 3 (three) times daily as needed for cough., Disp: 30 capsule, Rfl: 1   cyclobenzaprine (FLEXERIL) 10 MG tablet, Take 1 tablet (10 mg total) by mouth 3 (three) times daily as needed for muscle spasms., Disp: 30 tablet, Rfl: 0   guaiFENesin-dextromethorphan (ROBITUSSIN DM) 100-10 MG/5ML syrup, Take 5 mLs by mouth every 4 (four) hours as needed for cough., Disp: 118 mL, Rfl: 0   oxyCODONE-acetaminophen (PERCOCET/ROXICET) 5-325 MG per tablet, Take 1-2 tablets by mouth every 6 (six) hours as needed for pain.  (Patient not taking: Reported on 04/04/2016), Disp: 12 tablet, Rfl: 0   pantoprazole (PROTONIX) 20 MG tablet, Take 1 tablet (20 mg total) by mouth daily., Disp: 90 tablet, Rfl: 1   No Known Allergies   Review of Systems  Constitutional:  Positive for fatigue.  HENT:  Negative for congestion.   Respiratory:  Negative for cough, shortness of breath and wheezing.   Cardiovascular:  Positive for chest pain. Negative for palpitations.  Gastrointestinal:  Negative for constipation, diarrhea and vomiting.  Musculoskeletal:  Negative for arthralgias and myalgias.  Neurological:  Positive for dizziness. Negative for weakness.    Today's Vitals   11/16/21 1412  Temp: 98.8 F (37.1 C)  Weight: 161 lb 3.2 oz (73.1 kg)  Height: _0  (1.626 m)   Body mass index is 27.67 kg/m.   Objective:  Physical Exam Constitutional:      Appearance: Normal appearance. She is obese.  HENT:     Head: Normocephalic and atraumatic.  Cardiovascular:     Rate and Rhythm: Normal rate and regular rhythm.     Pulses: Normal pulses.     Heart sounds: Normal heart sounds. No murmur heard. Pulmonary:     Effort: Pulmonary effort is normal. No respiratory distress.     Breath sounds: Normal breath sounds. No wheezing.  Skin:    General: Skin is warm and dry.     Capillary Refill: Capillary refill takes less than 2 seconds.  Neurological:     Mental Status: She is alert and  oriented to person, place, and time.        Assessment And Plan:     1. Dizziness -Will check labs and assess  - POCT Urinalysis Dipstick (81002) - CBC with Diff - CMP14+EGFR  2. Chest pain, unspecified type - EKG 12-Lead  3. Hyperthyroidism -Reviewed prior results from 09/09/21; prior referral sent to endocrinologist but patient has not made a appt with them yet.  - TSH + free T4 - Ambulatory referral to Endocrinology  4. Indigestion -pt. has tried Protonix without any relief.  -Advised patient  - Ambulatory referral to  Gastroenterology  5. Leukocytes in urine - Culture, Urine  6. Hematuria, unspecified type - Ambulatory referral to Urology   The patient was encouraged to call or send a message through [place] for any questions or concerns.   Follow up: if symptoms persist or do not get better.   Side effects and appropriate use of all the medication(s) were discussed with the patient today. Patient advised to use the medication(s) as directed by their healthcare provider. The patient was encouraged to read, review, and understand all associated package inserts and contact our office with any questions or concerns. The patient accepts the risks of the treatment plan and had an opportunity to ask questions.   Patient was given opportunity to ask questions. Patient verbalized understanding of the plan and was able to repeat key elements of the plan. All questions were answered to their satisfaction.  Casey Eileen Croswell, DNP   I, Casey Graycie Halley have reviewed all documentation for this visit. The documentation on 11/16/21 for the exam, diagnosis, procedures, and orders are all accurate and complete.   IF YOU HAVE BEEN REFERRED TO A SPECIALIST, IT MAY TAKE 1-2 WEEKS TO SCHEDULE/PROCESS THE REFERRAL. IF YOU HAVE NOT HEARD FROM US/SPECIALIST IN TWO WEEKS, PLEASE GIVE [place] A CALL AT [phone].   THE PATIENT IS ENCOURAGED TO PRACTICE SOCIAL DISTANCING DUE TO THE COVID-19 PANDEMIC.

## 2021-11-17 ENCOUNTER — Encounter: Payer: Self-pay | Admitting: Nurse Practitioner

## 2021-11-17 LAB — CMP14+EGFR
ALT: 11 IU/L (ref 0–32)
AST: 13 IU/L (ref 0–40)
Albumin/Globulin Ratio: 1.5 (ref 1.2–2.2)
Albumin: 4.4 g/dL (ref 3.8–4.8)
Alkaline Phosphatase: 81 IU/L (ref 44–121)
BUN/Creatinine Ratio: 13 (ref 12–28)
BUN: 13 mg/dL (ref 8–27)
Bilirubin Total: <0.2 mg/dL (ref 0.0–1.2)
CO2: 27 mmol/L (ref 20–29)
Calcium: 10 mg/dL (ref 8.7–10.3)
Chloride: 102 mmol/L (ref 96–106)
Creatinine, Ser: 0.98 mg/dL (ref 0.57–1.00)
Globulin, Total: 2.9 g/dL (ref 1.5–4.5)
Glucose: 86 mg/dL (ref 70–99)
Potassium: 4.4 mmol/L (ref 3.5–5.2)
Sodium: 142 mmol/L (ref 134–144)
Total Protein: 7.3 g/dL (ref 6.0–8.5)
eGFR: 62 mL/min/{1.73_m2} (ref >59)

## 2021-11-17 LAB — CBC WITH DIFFERENTIAL/PLATELET
Basophils Absolute: 0.1 10*3/uL (ref 0.0–0.2)
Basos: 1 %
EOS (ABSOLUTE): 0.2 10*3/uL (ref 0.0–0.4)
Eos: 3 %
Hematocrit: 40.5 % (ref 34.0–46.6)
Hemoglobin: 13.3 g/dL (ref 11.1–15.9)
Immature Grans (Abs): 0 10*3/uL (ref 0.0–0.1)
Immature Granulocytes: 0 %
Lymphocytes Absolute: 2.4 10*3/uL (ref 0.7–3.1)
Lymphs: 40 %
MCH: 26 pg — ABNORMAL LOW (ref 26.6–33.0)
MCHC: 32.8 g/dL (ref 31.5–35.7)
MCV: 79 fL (ref 79–97)
Monocytes Absolute: 0.5 10*3/uL (ref 0.1–0.9)
Monocytes: 8 %
Neutrophils Absolute: 2.8 10*3/uL (ref 1.4–7.0)
Neutrophils: 48 %
Platelets: 317 10*3/uL (ref 150–450)
RBC: 5.12 x10E6/uL (ref 3.77–5.28)
RDW: 12.7 % (ref 11.7–15.4)
WBC: 5.9 10*3/uL (ref 3.4–10.8)

## 2021-11-17 LAB — TSH+FREE T4
Free T4: 1.12 ng/dL (ref 0.82–1.77)
TSH: 0.285 u[IU]/mL — ABNORMAL LOW (ref 0.450–4.500)

## 2021-11-19 LAB — URINE CULTURE

## 2021-12-10 ENCOUNTER — Ambulatory Visit (INDEPENDENT_AMBULATORY_CARE_PROVIDER_SITE_OTHER): Payer: Medicare Other | Admitting: Endocrinology

## 2021-12-10 ENCOUNTER — Encounter: Payer: Self-pay | Admitting: Endocrinology

## 2021-12-10 ENCOUNTER — Other Ambulatory Visit: Payer: Self-pay

## 2021-12-10 DIAGNOSIS — E0590 Thyrotoxicosis, unspecified without thyrotoxic crisis or storm: Secondary | ICD-10-CM

## 2021-12-10 NOTE — Patient Instructions (Signed)
We'll hold off on medication for now. Please come back for a follow-up appointment in 3 months.

## 2021-12-10 NOTE — Progress Notes (Signed)
Subjective:    Patient ID: Casey Zhang, female    DOB: [birth year], 71 y.o.   MRN: [id number]  HPI Pt is referred by Bary Castilla, NP, for hyperthyroidism.  she was dx'ed with hyperthyroidism in late 2022.  she has never been on therapy for this.  she has never had XRT to the anterior neck, or thyroid surgery.  she has never had thyroid imaging.  she does not consume non-prescribed thyroid medication.  she has never been on amiodarone.  She reports sweating.   No past medical history on file.  Past Surgical History:  Procedure Laterality Date   BACK SURGERY     HEMORRHOID SURGERY      Social History   Socioeconomic History   Marital status: Single    Spouse name: Not on file   Number of children: Not on file   Years of education: Not on file   Highest education level: Not on file  Occupational History   Occupation: [employer]  Tobacco Use   Smoking status: Never   Smokeless tobacco: Never  Substance and Sexual Activity   Alcohol use: No   Drug use: No   Sexual activity: Not Currently  Other Topics Concern   Not on file  Social History Narrative   Not on file   Social Determinants of Health   Financial Resource Strain: Not on file  Food Insecurity: Not on file  Transportation Needs: Not on file  Physical Activity: Not on file  Stress: Not on file  Social Connections: Not on file  Intimate Partner Violence: Not on file    Current Outpatient Medications on File Prior to Visit  Medication Sig Dispense Refill   Aspirin-Salicylamide-Caffeine (BC FAST PAIN RELIEF) [phone] MG PACK Take 2 packets by mouth as needed (for pain).     No current facility-administered medications on file prior to visit.    No Known Allergies  Family History  Problem Relation Age of Onset   Diabetes Maternal Grandmother    Thyroid disease Neg Hx     There were no vitals taken for this visit.    Review of Systems denies weight loss, palpitations, sob, tremor, anxiety, and  heat intolerance.      Objective:   Physical Exam VS: see vs page GEN: no distress HEAD: head: no deformity eyes: no periorbital swelling, no proptosis external nose and ears are normal NECK: thyroid is slightly and diffusely enlarged.  CHEST WALL: no deformity.   LUNGS: clear to auscultation.   CV: reg rate and rhythm, no murmur.  MUSCULOSKELETAL: gait is normal and steady.   EXTEMITIES: no deformity.  no leg edema NEURO:  readily moves all 4's.  sensation is intact to touch on all 4's.  No tremor.   SKIN:  Normal texture and temperature.  No rash or suspicious lesion is visible.  Not diaphoretic.   NODES:  None palpable at the neck.   PSYCH: alert, well-oriented.  Does not appear anxious nor depressed.     Lab Results  Component Value Date   TSH 0.285 (L) 11/16/2021   I have reviewed outside records, and summarized:  Pt was noted to have abnormal TFT, and referred here.  Main symptom addressed was lightheadedness.      Assessment & Plan:  Hyperthyroidism.  We discussed.  As it is mild, pt declines rx.    Patient Instructions  We'll hold off on medication for now. Please come back for a follow-up appointment in 3 months.

## 2021-12-12 DIAGNOSIS — E0590 Thyrotoxicosis, unspecified without thyrotoxic crisis or storm: Secondary | ICD-10-CM | POA: Insufficient documentation

## 2021-12-21 ENCOUNTER — Other Ambulatory Visit: Payer: Self-pay | Admitting: Nurse Practitioner

## 2022-02-16 ENCOUNTER — Ambulatory Visit: Payer: Medicare Other | Admitting: Nurse Practitioner

## 2022-03-10 ENCOUNTER — Ambulatory Visit: Payer: Medicare Other | Admitting: Endocrinology

## 2022-07-11 ENCOUNTER — Telehealth: Payer: Self-pay | Admitting: Nurse Practitioner

## 2022-07-11 NOTE — Telephone Encounter (Signed)
Left message for patient to call back and schedule Medicare Annual Wellness Visit (AWV) either virtually or in office.  Left both my jabber number [phone] and office number    Last AWV ;09/09/21 please schedule at anytime with [facility]

## 2022-08-01 ENCOUNTER — Telehealth: Payer: Self-pay | Admitting: Nurse Practitioner

## 2022-08-01 NOTE — Telephone Encounter (Signed)
Left message for patient to call back and schedule Medicare Annual Wellness Visit (AWV) either virtually or in office.  Left both my jabber number [phone] and office number    Last AWV 09/09/21 ; please schedule at anytime with [facility]

## 2022-08-23 ENCOUNTER — Telehealth: Payer: Self-pay | Admitting: Nurse Practitioner

## 2022-08-23 NOTE — Telephone Encounter (Signed)
Left message for patient to call back and schedule Medicare Annual Wellness Visit (AWV) either virtually or in office.  Left both my jabber number [phone] and office number    Last AWV ;09/09/21  please schedule at anytime with [facility]

## 2022-09-20 ENCOUNTER — Telehealth: Payer: Self-pay | Admitting: Nurse Practitioner

## 2022-09-20 NOTE — Telephone Encounter (Signed)
Left message for patient to call back and schedule Medicare Annual Wellness Visit (AWV) either virtually or in office. Left  my jabber number [phone]   Last AWV  09/09/21 please schedule with Nurse Health Adviser   45 min for awv-i and in office appointments 30 min for awv-s  phone/virtual appointments   

## 2022-10-04 ENCOUNTER — Telehealth: Payer: Self-pay | Admitting: Nurse Practitioner

## 2022-10-04 NOTE — Telephone Encounter (Signed)
Left message for patient to call back and schedule Medicare Annual Wellness Visit (AWV) either virtually or in office. Left  my Herbie Drape number [phone]   Last AWV  09/09/21 please schedule with Nurse Health Adviser   45 min for awv-i and in office appointments 30 min for awv-s  phone/virtual appointments

## 2023-04-20 DIAGNOSIS — M79622 Pain in left upper arm: Secondary | ICD-10-CM | POA: Diagnosis not present

## 2023-04-20 DIAGNOSIS — R0789 Other chest pain: Secondary | ICD-10-CM | POA: Diagnosis not present

## 2023-04-20 DIAGNOSIS — R946 Abnormal results of thyroid function studies: Secondary | ICD-10-CM | POA: Diagnosis not present

## 2023-04-20 DIAGNOSIS — R7989 Other specified abnormal findings of blood chemistry: Secondary | ICD-10-CM | POA: Diagnosis not present

## 2023-04-20 DIAGNOSIS — Z87891 Personal history of nicotine dependence: Secondary | ICD-10-CM | POA: Diagnosis not present

## 2023-04-20 DIAGNOSIS — R232 Flushing: Secondary | ICD-10-CM | POA: Diagnosis not present

## 2023-04-20 DIAGNOSIS — R42 Dizziness and giddiness: Secondary | ICD-10-CM | POA: Diagnosis not present

## 2023-04-20 DIAGNOSIS — M542 Cervicalgia: Secondary | ICD-10-CM | POA: Diagnosis not present

## 2023-04-25 DIAGNOSIS — R0789 Other chest pain: Secondary | ICD-10-CM | POA: Diagnosis not present

## 2023-04-27 DIAGNOSIS — R079 Chest pain, unspecified: Secondary | ICD-10-CM | POA: Diagnosis not present

## 2023-05-04 DIAGNOSIS — Z87891 Personal history of nicotine dependence: Secondary | ICD-10-CM | POA: Diagnosis not present

## 2023-05-04 DIAGNOSIS — M25512 Pain in left shoulder: Secondary | ICD-10-CM | POA: Diagnosis not present

## 2023-05-04 DIAGNOSIS — R7989 Other specified abnormal findings of blood chemistry: Secondary | ICD-10-CM | POA: Diagnosis not present

## 2023-05-04 DIAGNOSIS — M5030 Other cervical disc degeneration, unspecified cervical region: Secondary | ICD-10-CM | POA: Diagnosis not present

## 2023-05-04 DIAGNOSIS — G8929 Other chronic pain: Secondary | ICD-10-CM | POA: Diagnosis not present

## 2023-08-21 ENCOUNTER — Other Ambulatory Visit (HOSPITAL_BASED_OUTPATIENT_CLINIC_OR_DEPARTMENT_OTHER): Payer: Self-pay | Admitting: Family Medicine

## 2023-08-21 DIAGNOSIS — R42 Dizziness and giddiness: Secondary | ICD-10-CM | POA: Diagnosis not present

## 2023-08-21 DIAGNOSIS — R0789 Other chest pain: Secondary | ICD-10-CM | POA: Diagnosis not present

## 2023-08-21 DIAGNOSIS — I10 Essential (primary) hypertension: Secondary | ICD-10-CM | POA: Diagnosis not present

## 2023-08-21 DIAGNOSIS — R7989 Other specified abnormal findings of blood chemistry: Secondary | ICD-10-CM | POA: Diagnosis not present

## 2023-08-21 DIAGNOSIS — Z13220 Encounter for screening for lipoid disorders: Secondary | ICD-10-CM | POA: Diagnosis not present

## 2023-08-22 ENCOUNTER — Other Ambulatory Visit: Payer: Self-pay | Admitting: Family Medicine

## 2023-08-22 DIAGNOSIS — R42 Dizziness and giddiness: Secondary | ICD-10-CM

## 2023-08-23 DIAGNOSIS — R079 Chest pain, unspecified: Secondary | ICD-10-CM | POA: Diagnosis not present

## 2023-08-30 ENCOUNTER — Ambulatory Visit (HOSPITAL_BASED_OUTPATIENT_CLINIC_OR_DEPARTMENT_OTHER): Payer: 59

## 2023-09-06 ENCOUNTER — Other Ambulatory Visit: Payer: 59

## 2023-09-20 DIAGNOSIS — R42 Dizziness and giddiness: Secondary | ICD-10-CM | POA: Diagnosis not present

## 2023-09-20 DIAGNOSIS — R7989 Other specified abnormal findings of blood chemistry: Secondary | ICD-10-CM | POA: Diagnosis not present

## 2023-10-09 DIAGNOSIS — R42 Dizziness and giddiness: Secondary | ICD-10-CM | POA: Diagnosis not present

## 2024-01-19 ENCOUNTER — Ambulatory Visit: Payer: Medicare Other | Admitting: Obstetrics and Gynecology
